# Patient Record
Sex: Male | Born: 1990
Health system: Southern US, Community
[De-identification: ages and names within clinical notes are randomized; demographics above are authoritative.]

## PROBLEM LIST (undated history)

## (undated) DIAGNOSIS — F4310 Post-traumatic stress disorder, unspecified: Secondary | ICD-10-CM

## (undated) DIAGNOSIS — F419 Anxiety disorder, unspecified: Secondary | ICD-10-CM

## (undated) HISTORY — DX: Anxiety disorder, unspecified: F41.9

## (undated) HISTORY — DX: Post-traumatic stress disorder, unspecified: F43.10

---

## 2019-07-20 ENCOUNTER — Other Ambulatory Visit: Payer: Self-pay

## 2019-07-20 ENCOUNTER — Telehealth: Payer: Self-pay | Admitting: Psychiatry

## 2019-09-22 ENCOUNTER — Other Ambulatory Visit: Payer: Self-pay

## 2019-09-22 ENCOUNTER — Telehealth (INDEPENDENT_AMBULATORY_CARE_PROVIDER_SITE_OTHER): Payer: Managed Care, Other (non HMO) | Admitting: Psychiatry

## 2019-09-22 ENCOUNTER — Encounter: Payer: Self-pay | Admitting: Psychiatry

## 2019-09-22 DIAGNOSIS — R41840 Attention and concentration deficit: Secondary | ICD-10-CM | POA: Diagnosis not present

## 2019-09-22 DIAGNOSIS — F410 Panic disorder [episodic paroxysmal anxiety] without agoraphobia: Secondary | ICD-10-CM | POA: Insufficient documentation

## 2019-09-22 DIAGNOSIS — F4310 Post-traumatic stress disorder, unspecified: Secondary | ICD-10-CM | POA: Diagnosis not present

## 2019-09-22 DIAGNOSIS — M549 Dorsalgia, unspecified: Secondary | ICD-10-CM | POA: Insufficient documentation

## 2019-09-22 DIAGNOSIS — F419 Anxiety disorder, unspecified: Secondary | ICD-10-CM | POA: Insufficient documentation

## 2019-09-22 MED ORDER — PRAZOSIN HCL 1 MG PO CAPS: 1.0000 mg | ORAL_CAPSULE | Freq: Every day | ORAL | 1 refills | Status: AC

## 2019-09-22 MED ORDER — SERTRALINE HCL 100 MG PO TABS: 100.0000 mg | ORAL_TABLET | Freq: Every day | ORAL | 0 refills | Status: AC
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
## 2019-09-22 NOTE — Patient Instructions (Signed)
Prazosin capsules What is this medicine? PRAZOSIN (PRA zoe sin) is an antihypertensive. It works by relaxing the blood vessels. It is used to treat high blood pressure. This medicine may be used for other purposes; ask your health care provider or pharmacist if you have questions. COMMON BRAND NAME(S): Minipress What should I tell my health care provider before I take this medicine? They need to know if you have any of the following conditions:  kidney disease  an unusual or allergic reaction to prazosin, other medicines, foods, dyes, or preservatives  pregnant or trying to get pregnant  breast-feeding How should I use this medicine? Take this medicine by mouth with a glass of water. Follow the directions on the prescription label. Take your doses at regular intervals. Do not take your medicine more often than directed. Do not stop taking except on the advice of your doctor or health care professional. Talk to your pediatrician regarding the use of this medicine in children. Special care may be needed. Overdosage: If you think you have taken too much of this medicine contact a poison control center or emergency room at once. NOTE: This medicine is only for you. Do not share this medicine with others. What if I miss a dose? If you miss a dose, take it as soon as you can. If it is almost time for your next dose, take only that dose. Do not take double or extra doses. What may interact with this medicine?  diuretics  medicines for high blood pressure  sildenafil  tadalafil  vardenafil This list may not describe all possible interactions. Give your health care provider a list of all the medicines, herbs, non-prescription drugs, or dietary supplements you use. Also tell them if you smoke, drink alcohol, or use illegal drugs. Some items may interact with your medicine. What should I watch for while using this medicine? Visit your doctor or health care professional for regular checks on  your progress. Check your blood pressure regularly. Ask your doctor or health care professional what your blood pressure should be and when you should contact him or her. Drowsiness and dizziness are more likely to occur after the first dose, after an increase in dose, or during hot weather or exercise. These effects can decrease once your body adjusts to this medicine. Do not drive, use machinery, or do anything that needs mental alertness until you know how this drug affects you. Do not stand or sit up quickly, especially if you are an older patient. This reduces the risk of dizzy or fainting spells. Alcohol can make you more drowsy and dizzy. Avoid alcoholic drinks. Do not treat yourself for coughs, colds or allergies without asking your doctor or health care professional for advice. Some ingredients can increase your blood pressure. Your mouth may get dry. Chewing sugarless gum or sucking hard candy, and drinking plenty of water may help. Contact your doctor if the problem does not go away or is severe. For males, contact your doctor or health care professional right away if you have an erection that lasts longer than 4 hours or if it becomes painful. This may be a sign of a serious problem and must be treated right away to prevent permanent damage. What side effects may I notice from receiving this medicine? Side effects that you should report to your doctor or health care professional as soon as possible:  blurred vision  difficulty breathing, shortness of breath  fainting spells, light headedness  fast or irregular heartbeat, palpitations   or chest pain  numbness in hands or feet  prolonged painful erection of the penis  swelling of the legs or ankles  unusually weak or tired Side effects that usually do not require medical attention (report to your doctor or health care professional if they continue or are bothersome):  constipation or diarrhea  headache  nausea  sexual  difficulties  stomach pain This list may not describe all possible side effects. Call your doctor for medical advice about side effects. You may report side effects to FDA at 1-800-FDA-1088. Where should I keep my medicine? Keep out of the reach of children. Store at room temperature between 15 and 30 degrees C (59 and 86 degrees F). Protect from light. Keep container tightly closed. Throw away any unused medicine after the expiration date. NOTE: This sheet is a summary. It may not cover all possible information. If you have questions about this medicine, talk to your doctor, pharmacist, or health care provider.  2020 Elsevier/Gold Standard (2013-07-27 09:13:50)  

## 2019-09-23 ENCOUNTER — Encounter: Payer: Self-pay | Admitting: Psychiatry

## 2019-11-05 ENCOUNTER — Telehealth (INDEPENDENT_AMBULATORY_CARE_PROVIDER_SITE_OTHER): Payer: Self-pay | Admitting: Psychiatry

## 2019-11-05 ENCOUNTER — Other Ambulatory Visit: Payer: Self-pay

## 2019-11-05 DIAGNOSIS — F4310 Post-traumatic stress disorder, unspecified: Secondary | ICD-10-CM

## 2019-11-05 NOTE — Progress Notes (Signed)
Patient ID: Andre Bailey, male   DOB: 27-Mar-1990, 29 y.o.   MRN: [id number] No response to call or text or video invite

## 2020-07-10 ENCOUNTER — Ambulatory Visit (INDEPENDENT_AMBULATORY_CARE_PROVIDER_SITE_OTHER): Payer: No Typology Code available for payment source

## 2020-07-10 ENCOUNTER — Other Ambulatory Visit: Payer: Self-pay

## 2020-07-10 ENCOUNTER — Ambulatory Visit
Admission: EM | Admit: 2020-07-10 | Discharge: 2020-07-10 | Disposition: A | Discharge status: Home / Self Care | Payer: No Typology Code available for payment source | Attending: Sports Medicine | Admitting: Sports Medicine

## 2020-07-10 ENCOUNTER — Encounter: Payer: Self-pay | Admitting: Emergency Medicine

## 2020-07-10 DIAGNOSIS — W19XXXA Unspecified fall, initial encounter: Secondary | ICD-10-CM | POA: Diagnosis not present

## 2020-07-10 DIAGNOSIS — M25562 Pain in left knee: Secondary | ICD-10-CM | POA: Diagnosis not present

## 2020-07-10 DIAGNOSIS — M25462 Effusion, left knee: Secondary | ICD-10-CM | POA: Diagnosis not present

## 2020-07-10 DIAGNOSIS — S8992XA Unspecified injury of left lower leg, initial encounter: Secondary | ICD-10-CM | POA: Diagnosis not present

## 2020-07-10 NOTE — ED Triage Notes (Signed)
Patient states that states that he fell at the [place] today.  Patient c/o pain in his left knee.

## 2020-07-10 NOTE — Discharge Instructions (Signed)
As we discussed, your x-ray does not show any acute fracture or bony abnormality. Please see educational handouts. I gave you the name of a sports medicine physician as well as a orthopedic group and you can call them and see you when you can get into see them. Over-the-counter meds as needed, Tylenol or ibuprofen for discomfort. Icing and elevation. Please wean the crutches. You can use an Ace bandage or knee brace as needed. I gave you a work note. If symptoms persist and you can get into Ortho please see your primary care provider.

## 2020-07-10 NOTE — ED Provider Notes (Signed)
[facility]    CSN: [id number] Arrival date & time: 07/10/20  1449      History   Chief Complaint Chief Complaint  Patient presents with  . Knee Pain    HPI Andre Bailey is a 30 y.o. male.   Patient is a 30 year old male who presents for evaluation of the above issue.  He gets primary care needs met by the [place].  He works as a Magazine features editor.  He reports [place] park about 3 hours prior to arrival and he was in the wave pool and he slipped in the water and he felt and heard a pop in his left knee.  He thinks he went into valgus.  He said his foot slipped.  He has been getting a little bit of swelling in that knee.  He was concerned because he has had issues with his knee in the past and wanted to be evaluated today.  Has been icing and using an Ace bandage and elevating.  He had some crutches at home that he comes in on him.  He is able to put weight down but he just wanted to make sure before doing so.  He is just taken some Tylenol.  He does not have a orthopedist that he identifies with.  He did not injure his back he has no numbness or tingling.  He has no radicular symptoms.  No red flag signs or symptoms elicited on history.      Past Medical History:  Diagnosis Date  . Anxiety   . PTSD (post-traumatic stress disorder)     Patient Active Problem List   Diagnosis Date Noted  . Anxiety 09/22/2019  . Back pain 09/22/2019  . PTSD (post-traumatic stress disorder) 09/22/2019  . Panic attacks 09/22/2019  . Attention and concentration deficit 09/22/2019    History reviewed. No pertinent surgical history.     Home Medications    Prior to Admission medications   Medication Sig Start Date End Date Taking? Authorizing Provider  hydrOXYzine (ATARAX/VISTARIL) 25 MG tablet Take 25 mg by mouth every 6 (six) hours as needed. 05/23/19   [provider]  methocarbamol (ROBAXIN) 500 MG tablet Take 500 mg by mouth 3 (three) times daily.  06/19/19   [provider]  prazosin (MINIPRESS) 1 MG capsule Take 1 capsule (1 mg total) by mouth at bedtime. 09/22/19   Ursula Alert, MD  sertraline (ZOLOFT) 100 MG tablet Take 1 tablet (100 mg total) by mouth daily. 09/22/19   Ursula Alert, MD    Family History Family History  Problem Relation Age of Onset  . Alcohol abuse Paternal Grandfather     Social History Social History   Tobacco Use  . Smoking status: Former Smoker    Quit date: 02/13/2019    Years since quitting: 1.4  . Smokeless tobacco: Never Used  Vaping Use  . Vaping Use: Every day  Substance Use Topics  . Alcohol use: Yes    Comment: SOCIAL  . Drug use: Never     Allergies   Patient has no known allergies.   Review of Systems Review of Systems  Constitutional: Positive for activity change. Negative for appetite change, chills, diaphoresis, fatigue and fever.  HENT: Negative for congestion, ear pain, postnasal drip, rhinorrhea, sinus pressure, sinus pain, sneezing and sore throat.   Eyes: Negative for pain.  Respiratory: Negative for cough, chest tightness and shortness of breath.   Cardiovascular: Negative for chest pain and  palpitations.  Gastrointestinal: Negative for abdominal pain, diarrhea, nausea and vomiting.  Genitourinary: Negative for dysuria.  Musculoskeletal: Positive for arthralgias, gait problem and joint swelling. Negative for back pain, myalgias, neck pain and neck stiffness.  Skin: Negative for color change, pallor, rash and wound.  Neurological: Negative for dizziness, light-headedness, numbness and headaches.  All other systems reviewed and are negative.    Physical Exam Triage Vital Signs ED Triage Vitals  Enc Vitals Group     BP 07/10/20 1510 (!) 132/93     Pulse Rate 07/10/20 1510 87     Resp 07/10/20 1510 16     Temp 07/10/20 1510 98.1 F (36.7 C)     Temp Source 07/10/20 1510 Oral     SpO2 07/10/20 1510 97 %     Weight 07/10/20 1507 240 lb (108.9 kg)      Height 07/10/20 1507 '5\' 11"'  (1.803 m)     Head Circumference --      Peak Flow --      Pain Score 07/10/20 1507 3     Pain Loc --      Pain Edu? --      Excl. in [place]? --    No data found.  Updated Vital Signs BP (!) 132/93 (BP Location: Left Arm)   Pulse 87   Temp 98.1 F (36.7 C) (Oral)   Resp 16   Ht '5\' 11"'  (1.803 m)   Wt 108.9 kg   SpO2 97%   BMI 33.47 kg/m   Visual Acuity Right Eye Distance:   Left Eye Distance:   Bilateral Distance:    Right Eye Near:   Left Eye Near:    Bilateral Near:     Physical Exam Vitals and nursing note reviewed.  Constitutional:      General: He is not in acute distress.    Appearance: Normal appearance. He is not ill-appearing, toxic-appearing or diaphoretic.  HENT:     Head: Normocephalic and atraumatic.     Nose: Nose normal.     Mouth/Throat:     Mouth: Mucous membranes are moist.  Eyes:     Conjunctiva/sclera: Conjunctivae normal.     Pupils: Pupils are equal, round, and reactive to light.  Cardiovascular:     Rate and Rhythm: Normal rate and regular rhythm.     Pulses: Normal pulses.     Heart sounds: Normal heart sounds. No murmur heard. No friction rub. No gallop.   Pulmonary:     Effort: Pulmonary effort is normal.     Breath sounds: Normal breath sounds. No stridor. No wheezing, rhonchi or rales.  Musculoskeletal:     Cervical back: Normal range of motion and neck supple.     Right knee: Normal.     Left knee: Effusion present. No erythema, ecchymosis, lacerations or crepitus. Normal range of motion. Tenderness present over the medial joint line and MCL. No LCL laxity, MCL laxity, ACL laxity or PCL laxity.    Instability Tests: Anterior drawer test negative. Posterior drawer test negative. Anterior Lachman test negative. Medial McMurray test negative.     Comments: Patient has a trace effusion.  Patella is mildly ballotable.  He actually has fairly good range of motion.  No significant joint line tenderness.  He is a  little tender over the MCL.  No laxity on examination.  Skin:    General: Skin is warm and dry.     Capillary Refill: Capillary refill takes less than 2 seconds.  Neurological:  General: No focal deficit present.     Mental Status: He is alert and oriented to person, place, and time.  Psychiatric:        Mood and Affect: Mood normal.      UC Treatments / Results  Labs (all labs ordered are listed, but only abnormal results are displayed) Labs Reviewed - No data to display  EKG   Radiology DG Knee Complete 4 Views Left  Result Date: 07/10/2020 CLINICAL DATA:  Fall EXAM: LEFT KNEE - COMPLETE 4+ VIEW COMPARISON:  None. FINDINGS: No acute fracture or dislocation. Well corticated os ossific density along the medial aspect of the patella consistent with the sequela of remote prior trauma versus bipartite patella. Joint spaces and alignment are maintained. No area of erosion or osseous destruction. No unexpected radiopaque foreign body. Soft tissues are unremarkable. IMPRESSION: No acute fracture or dislocation. Electronically Signed   By: Valentino Saxon MD   On: 07/10/2020 15:25    Procedures Procedures (including critical care time)  Medications Ordered in UC Medications - No data to display  Initial Impression / Assessment and Plan / UC Course  I have reviewed the triage vital signs and the nursing notes.  Pertinent labs & imaging results that were available during my care of the patient were reviewed by me and considered in my medical decision making (see chart for details).  Clinical impression: Left knee injury with what appears to be a valgus type injury.  He has a trace effusion but his exam is fairly reassuring.  This is on a background history of him having problems with his knee greater than 10 years ago.  He does not have a local orthopedist.  Treatment plan: 1.  The findings and treatment plan were discussed in detail with the patient.  Patient was in  agreement. 2.  Recommended getting an x-ray.  It was ordered and interpreted by myself independently here in the office.  No acute fracture or dislocation noted.  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  There is a small joint effusion.  No acute fracture dislocations.  We will await radiology over read.  We will contact the patient if there is anything from my independent review. 3.  One of the wean off the crutches. 4.  Icing elevation. 5.  Educational handouts provided. 6.  Over-the-counter meds as needed. 7.  Give him a work note. 8.  Gave him the names of 2 orthopedic groups to call if he wants to have follow-up. 9.  He can use a brace or an Ace bandage for support. 10.  If he is not able to get into Ortho then he should contact his primary care provider. 11.  He was discharged in stable condition and he will follow-up here as needed.    Final Clinical Impressions(s) / UC Diagnoses   Final diagnoses:  Left knee injury, initial encounter  Acute pain of left knee  Effusion, left knee     Discharge Instructions     As we discussed, your x-ray does not show any acute fracture or bony abnormality. Please see educational handouts. I gave you the name of a sports medicine physician as well as a orthopedic group and you can call them and see you when you can get into see them. Over-the-counter meds as needed, Tylenol or ibuprofen for discomfort. Icing and elevation. Please wean the crutches. You can use an Ace bandage or knee brace as needed. I gave you a work note.  If symptoms persist and you can get into Ortho please see your primary care provider.    ED Prescriptions    None     PDMP not reviewed this encounter.   Verda Cumins, MD 07/10/20 [phone]

## 2021-09-05 IMAGING — CR DG KNEE COMPLETE 4+V*L*
4 series · 4 of 4 positions shown · non-contrast
Comparison: None.

CLINICAL DATA: Fall

EXAM:
LEFT KNEE - COMPLETE 4+ VIEW

[knee ap]
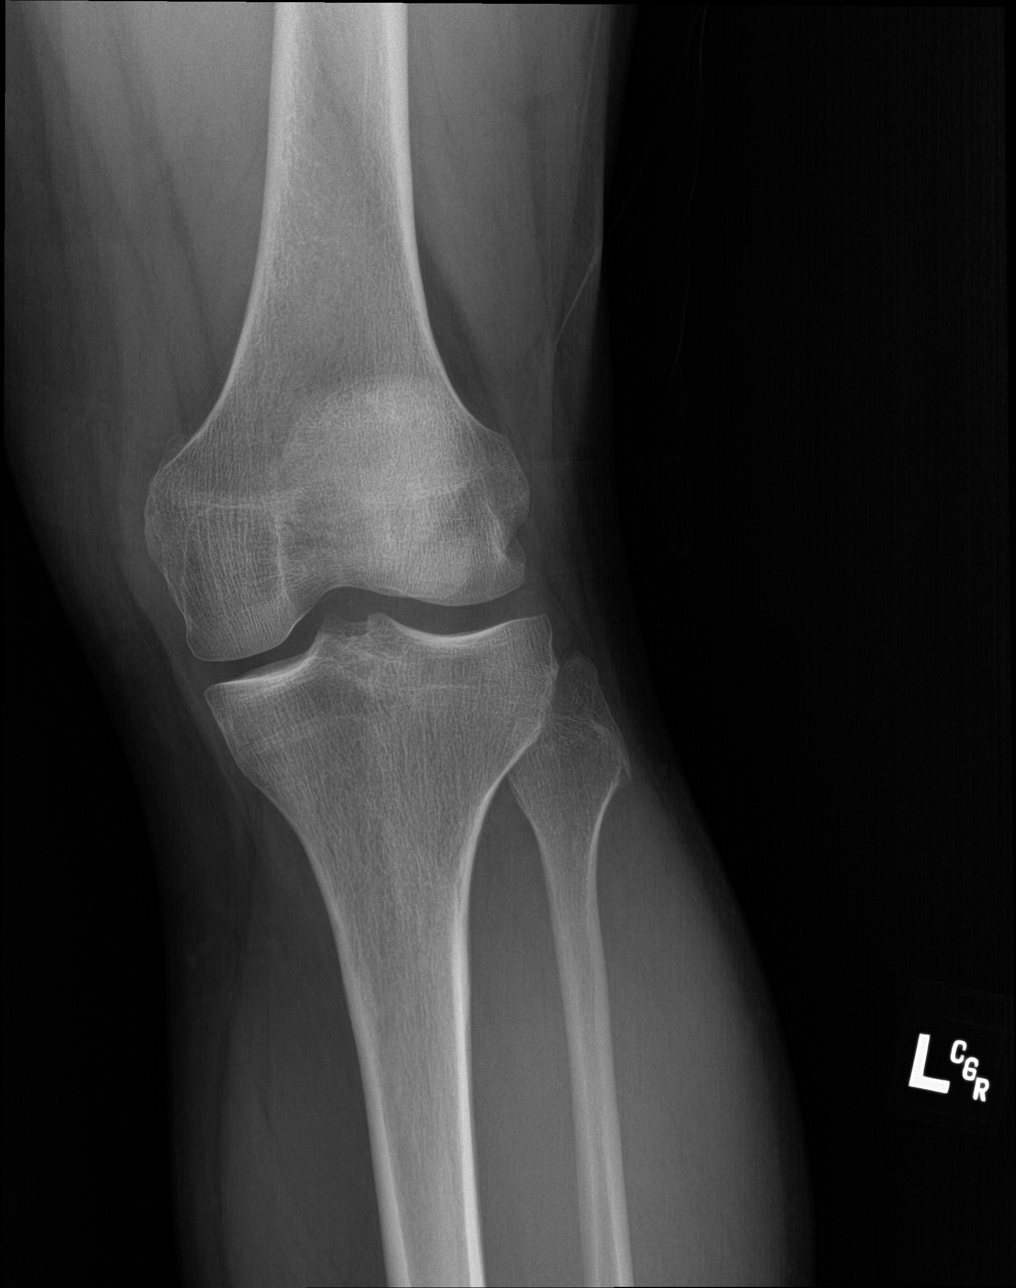

[knee lat]
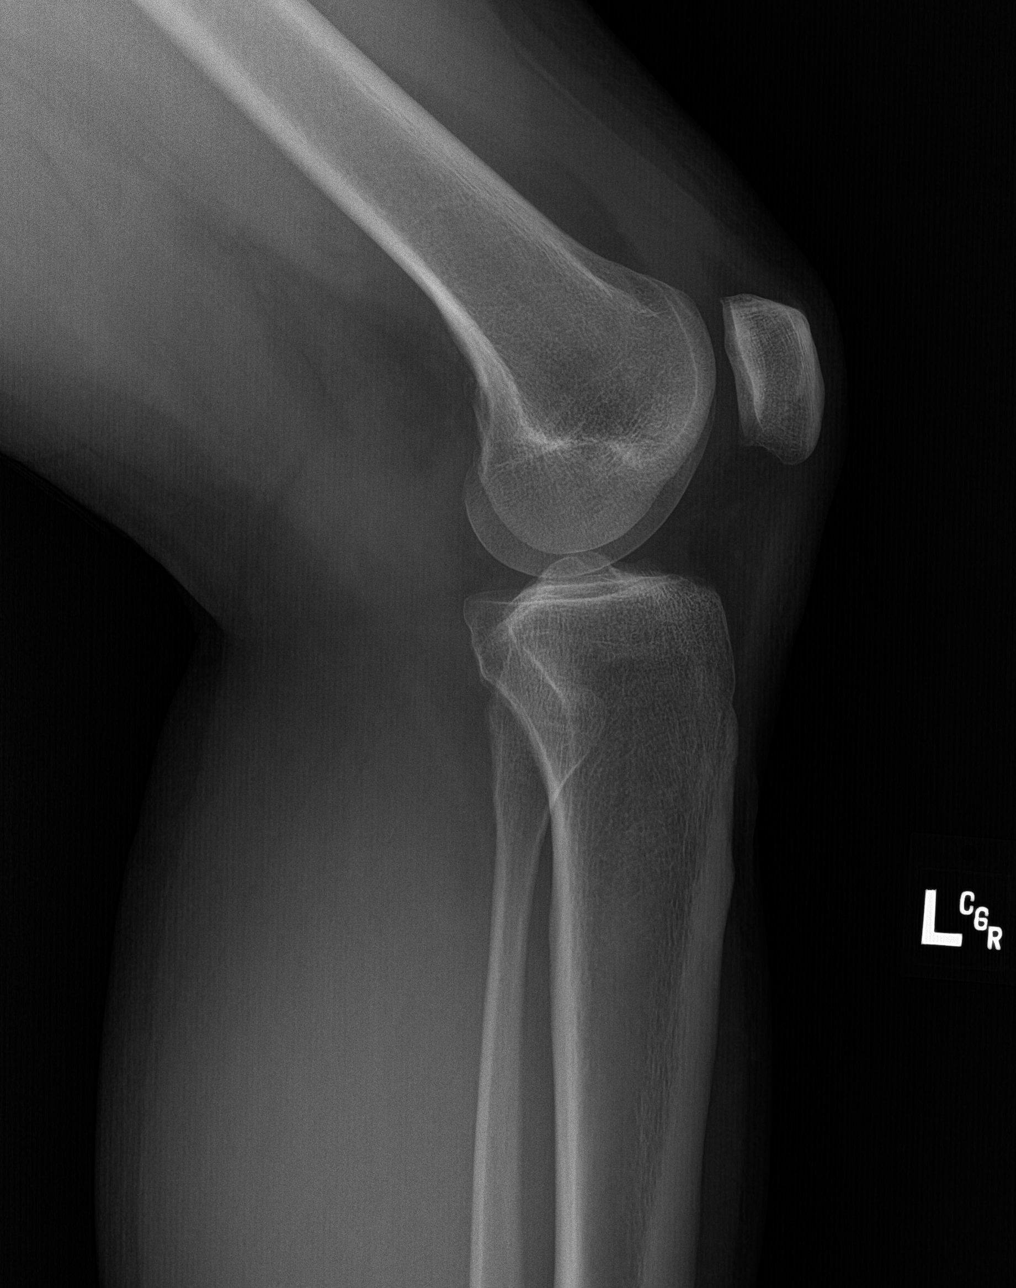

[tunnel]
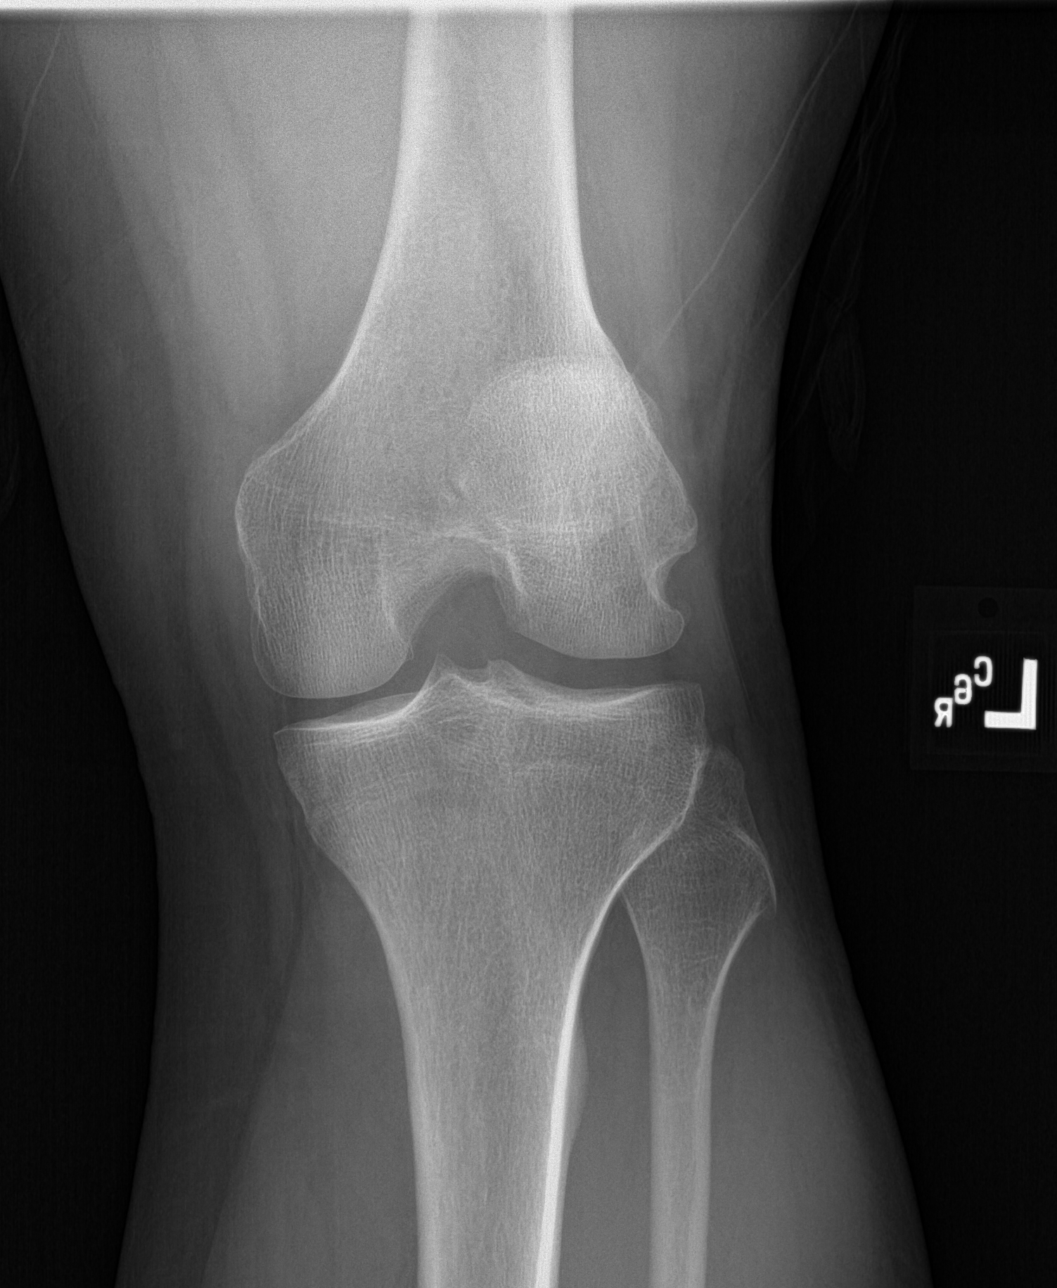

[patella skyline]
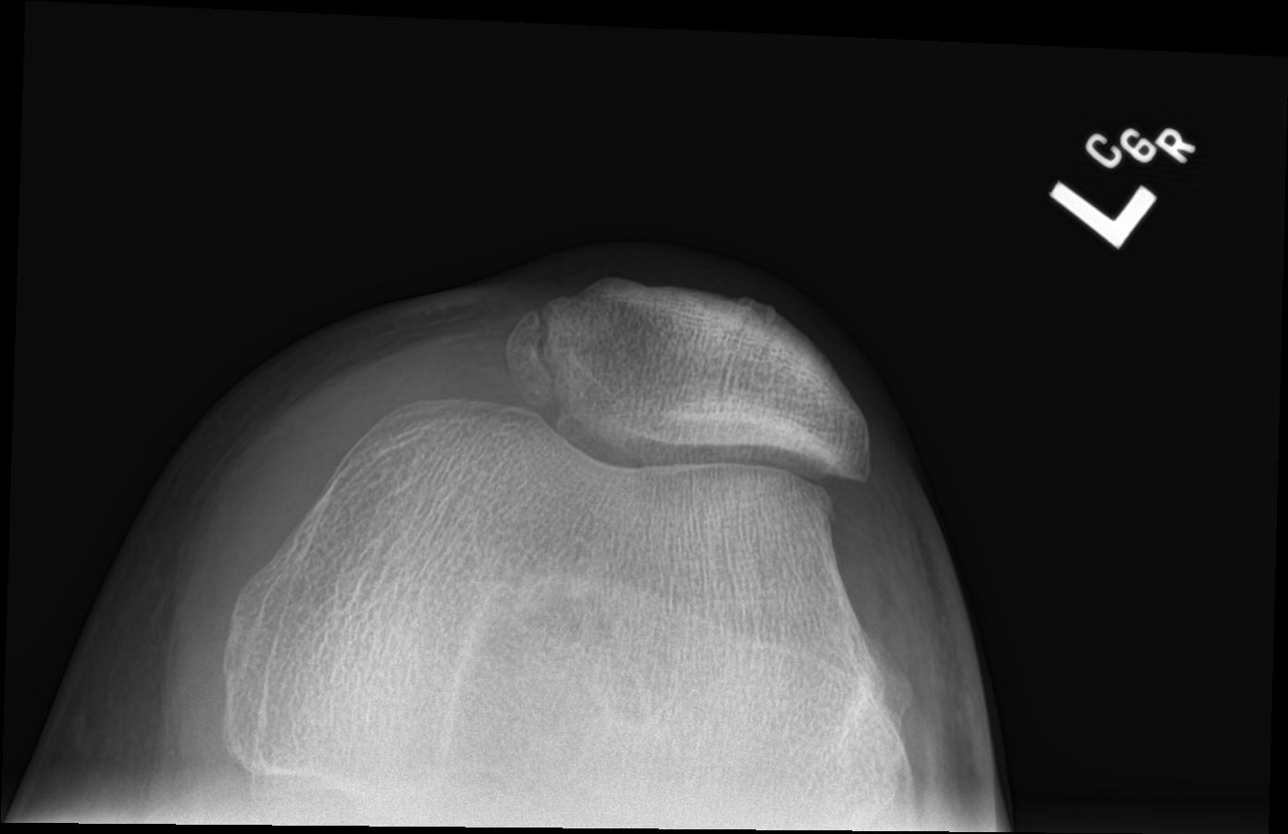

[4 of 4 positions shown; findings below may reference images not displayed]

FINDINGS: No acute fracture or dislocation. Well corticated os ossific density
along the medial aspect of the patella consistent with the sequela
of remote prior trauma versus bipartite patella. Joint spaces and
alignment are maintained. No area of erosion or osseous destruction.
No unexpected radiopaque foreign body. Soft tissues are
unremarkable.
IMPRESSION: No acute fracture or dislocation.
